# Patient Record
Sex: Male | Born: 1980 | Race: White | Hispanic: No | Marital: Single | Smoking: Current some day smoker
Health system: Southern US, Community
[De-identification: ages and names within clinical notes are randomized; demographics above are authoritative.]

## PROBLEM LIST (undated history)

## (undated) DIAGNOSIS — I10 Essential (primary) hypertension: Secondary | ICD-10-CM

## (undated) DIAGNOSIS — E785 Hyperlipidemia, unspecified: Secondary | ICD-10-CM

## (undated) DIAGNOSIS — T7840XA Allergy, unspecified, initial encounter: Secondary | ICD-10-CM

## (undated) DIAGNOSIS — G43909 Migraine, unspecified, not intractable, without status migrainosus: Secondary | ICD-10-CM

## (undated) HISTORY — DX: Migraine, unspecified, not intractable, without status migrainosus: G43.909

## (undated) HISTORY — DX: Allergy, unspecified, initial encounter: T78.40XA

## (undated) HISTORY — PX: KNEE SURGERY: SHX244

## (undated) HISTORY — DX: Hyperlipidemia, unspecified: E78.5

## (undated) HISTORY — DX: Essential (primary) hypertension: I10

---

## 2014-12-30 ENCOUNTER — Encounter (HOSPITAL_COMMUNITY): Payer: Self-pay | Admitting: Emergency Medicine

## 2014-12-30 ENCOUNTER — Emergency Department (HOSPITAL_COMMUNITY)
Admission: EM | Admit: 2014-12-30 | Discharge: 2014-12-31 | Disposition: A | Discharge status: Home / Self Care | Payer: BLUE CROSS/BLUE SHIELD | Attending: Emergency Medicine | Admitting: Emergency Medicine

## 2014-12-30 DIAGNOSIS — Z79899 Other long term (current) drug therapy: Secondary | ICD-10-CM | POA: Insufficient documentation

## 2014-12-30 DIAGNOSIS — F419 Anxiety disorder, unspecified: Secondary | ICD-10-CM | POA: Insufficient documentation

## 2014-12-30 DIAGNOSIS — R091 Pleurisy: Secondary | ICD-10-CM | POA: Diagnosis not present

## 2014-12-30 DIAGNOSIS — R0602 Shortness of breath: Secondary | ICD-10-CM | POA: Diagnosis not present

## 2014-12-30 DIAGNOSIS — R110 Nausea: Secondary | ICD-10-CM | POA: Diagnosis not present

## 2014-12-30 DIAGNOSIS — R079 Chest pain, unspecified: Secondary | ICD-10-CM | POA: Diagnosis present

## 2014-12-30 NOTE — ED Notes (Signed)
Pt c/o L chest pain radiating to L axila onset 1700 while watching movie, sharp with SHob and brief dizziness.

## 2014-12-31 ENCOUNTER — Emergency Department (HOSPITAL_COMMUNITY): Payer: BLUE CROSS/BLUE SHIELD

## 2014-12-31 LAB — CBC WITH DIFFERENTIAL/PLATELET
BASOS PCT: 1 % (ref 0–1)
Basophils Absolute: 0.1 10*3/uL (ref 0.0–0.1)
EOS ABS: 0.1 10*3/uL (ref 0.0–0.7)
Eosinophils Relative: 1 % (ref 0–5)
HCT: 44.7 % (ref 39.0–52.0)
Hemoglobin: 15.4 g/dL (ref 13.0–17.0)
Lymphocytes Relative: 30 % (ref 12–46)
Lymphs Abs: 2.5 10*3/uL (ref 0.7–4.0)
MCH: 30 pg (ref 26.0–34.0)
MCHC: 34.5 g/dL (ref 30.0–36.0)
MCV: 87 fL (ref 78.0–100.0)
Monocytes Absolute: 0.7 10*3/uL (ref 0.1–1.0)
Monocytes Relative: 9 % (ref 3–12)
NEUTROS ABS: 5.1 10*3/uL (ref 1.7–7.7)
NEUTROS PCT: 59 % (ref 43–77)
Platelets: 304 10*3/uL (ref 150–400)
RBC: 5.14 MIL/uL (ref 4.22–5.81)
RDW: 12.6 % (ref 11.5–15.5)
WBC: 8.5 10*3/uL (ref 4.0–10.5)

## 2014-12-31 LAB — COMPREHENSIVE METABOLIC PANEL
ALT: 24 U/L (ref 17–63)
AST: 19 U/L (ref 15–41)
Albumin: 4.3 g/dL (ref 3.5–5.0)
Alkaline Phosphatase: 81 U/L (ref 38–126)
Anion gap: 10 (ref 5–15)
BUN: 13 mg/dL (ref 6–20)
CALCIUM: 9.3 mg/dL (ref 8.9–10.3)
CO2: 22 mmol/L (ref 22–32)
CREATININE: 0.9 mg/dL (ref 0.61–1.24)
Chloride: 106 mmol/L (ref 101–111)
GFR calc Af Amer: >60 mL/min (ref >60)
Glucose, Bld: 107 mg/dL — ABNORMAL HIGH (ref 65–99)
POTASSIUM: 3.4 mmol/L — AB (ref 3.5–5.1)
SODIUM: 138 mmol/L (ref 135–145)
Total Bilirubin: 0.6 mg/dL (ref 0.3–1.2)
Total Protein: 7.4 g/dL (ref 6.5–8.1)

## 2014-12-31 LAB — I-STAT TROPONIN, ED: TROPONIN I, POC: 0 ng/mL (ref 0.00–0.08)

## 2014-12-31 LAB — TROPONIN I

## 2014-12-31 LAB — D-DIMER, QUANTITATIVE: D-Dimer, Quant: <0.27 ug/mL-FEU (ref 0.00–0.48)

## 2014-12-31 MED ORDER — IBUPROFEN 600 MG PO TABS: 600.0000 mg | ORAL_TABLET | Freq: Three times a day (TID) | ORAL | Status: DC

## 2014-12-31 MED ORDER — KETOROLAC TROMETHAMINE 60 MG/2ML IM SOLN
60.0000 mg | Freq: Once | INTRAMUSCULAR | Status: AC
  Administered 2014-12-31: 60 mg via INTRAMUSCULAR
  Filled 2014-12-31: qty 2

## 2014-12-31 NOTE — ED Notes (Signed)
Pt resting quietly with eyes closed

## 2014-12-31 NOTE — Discharge Instructions (Signed)

## 2014-12-31 NOTE — ED Notes (Signed)
Pt alert, oriented, and ambulatory upon DC. He was advised to establish and follow up with PCP.

## 2014-12-31 NOTE — ED Provider Notes (Signed)
CSN: [id number]     Arrival date & time 12/30/14  2304 History   First MD Initiated Contact with Patient 12/30/14 2340     Chief Complaint  Patient presents with  . Chest Pain     (Consider location/radiation/quality/duration/timing/severity/associated sxs/prior Treatment) HPI Patient presents with acute onset left parasternal chest pain that radiates to the left pectoralis and axilla. The pain started at 5:00 while watching a movie. He had some mild shortness of breath and nausea associated with this. Denied any cough, fever or chills. No recent extended travel or surgeries. No lower extremity swelling or pain. The chest pain is worse with deep breathing. States it's difficult to take a full breath. No previously similar symptoms. Does not smoke cigarettes. No family history of thromboembolic disease. Father died of MI in his late 44s. Patient states he's been under increased stress lately. History reviewed. No pertinent past medical history. Past Surgical History  Procedure Laterality Date  . Knee surgery     No family history on file. History  Substance Use Topics  . Smoking status: Never Smoker   . Smokeless tobacco: Not on file  . Alcohol Use: Yes     Comment: rare    Review of Systems  Constitutional: Negative for fever and chills.  Respiratory: Positive for shortness of breath. Negative for cough and wheezing.   Cardiovascular: Positive for chest pain. Negative for palpitations and leg swelling.  Gastrointestinal: Positive for nausea. Negative for vomiting, abdominal pain and diarrhea.  Musculoskeletal: Negative for back pain, neck pain and neck stiffness.  Skin: Negative for rash and wound.  Neurological: Positive for dizziness and light-headedness. Negative for syncope, weakness, numbness and headaches.  All other systems reviewed and are negative.     Allergies  Review of patient's allergies indicates no known allergies.  Home Medications   Prior to Admission  medications   Medication Sig Start Date End Date Taking? Authorizing Provider  albuterol (PROVENTIL HFA;VENTOLIN HFA) 108 (90 BASE) MCG/ACT inhaler Inhale 1-2 puffs into the lungs every 6 (six) hours as needed for wheezing or shortness of breath.   Yes Historical Provider, MD  Chilton Si Tea, Camillia sinensis, (GREEN TEA EXTRACT PO) Take 2 tablets by mouth 2 (two) times daily.   Yes Historical Provider, MD  ibuprofen (ADVIL,MOTRIN) 600 MG tablet Take 1 tablet (600 mg total) by mouth 3 (three) times daily after meals. 12/31/14   Loren Racer, MD  Ranitidine HCl (ACID REDUCER PO) Take 1 tablet by mouth daily as needed (acid reflux).   Yes Historical Provider, MD   BP 142/81 mmHg  Pulse 72  Temp(Src) 98 F (36.7 C) (Oral)  Resp 23  Ht 5\' 10"  (1.778 m)  Wt 278 lb (126.1 kg)  BMI 39.89 kg/m2  SpO2 96% Physical Exam  Constitutional: He is oriented to person, place, and time. He appears well-developed and well-nourished. No distress.  Mildly anxious  HENT:  Head: Normocephalic and atraumatic.  Mouth/Throat: Oropharynx is clear and moist.  Eyes: EOM are normal. Pupils are equal, round, and reactive to light.  Neck: Normal range of motion. Neck supple.  Cardiovascular: Normal rate and regular rhythm.  Exam reveals no gallop and no friction rub.   No murmur heard. Pulmonary/Chest: Effort normal and breath sounds normal. No respiratory distress. He has no wheezes. He has no rales. He exhibits no tenderness.  Abdominal: Soft. Bowel sounds are normal. He exhibits no distension and no mass. There is no tenderness. There is no rebound and no guarding.  Musculoskeletal: Normal range of motion. He exhibits no edema or tenderness.  No calf swelling or tenderness.  Neurological: He is alert and oriented to person, place, and time.  Moves all extremities without deficit. Sensation is grossly intact.  Skin: Skin is warm and dry. No rash noted. No erythema.  Psychiatric: His behavior is normal.  Nursing  note and vitals reviewed.   ED Course  Procedures (including critical care time) Labs Review Labs Reviewed  COMPREHENSIVE METABOLIC PANEL - Abnormal; Notable for the following:    Potassium 3.4 (*)    Glucose, Bld 107 (*)    All other components within normal limits  CBC WITH DIFFERENTIAL/PLATELET  TROPONIN I  D-DIMER, QUANTITATIVE (NOT AT [facility])  Rosezena Sensor, ED    Imaging Review Dg Chest 2 View  12/31/2014   CLINICAL DATA:  Left chest pain radiating in the left axilla, onset at 17:00  EXAM: CHEST  2 VIEW  COMPARISON:  None.  FINDINGS: The heart size and mediastinal contours are within normal limits. Both lungs are clear. The visualized skeletal structures are unremarkable.  IMPRESSION: No active cardiopulmonary disease.   Electronically Signed   By: Ellery Plunk M.D.   On: 12/31/2014 01:56     EKG Interpretation   Date/Time:  Saturday December 30 2014 23:12:01 EDT Ventricular Rate:  92 PR Interval:  146 QRS Duration: 92 QT Interval:  339 QTC Calculation: 419 R Axis:   63 Text Interpretation:  Age not entered, assumed to be  34 years old for  purpose of ECG interpretation Sinus rhythm Inferior infarct, old Confirmed  by Ranae Palms  MD, Xena Propst ([id number]) on 12/31/2014 12:16:44 AM      MDM   Final diagnoses:  Chest pain  Pleurisy   Troponin 2 is normal. EKG without any evidence of ischemia. Patient also has a normal d-dimer. Chest pain is consistent with pleurisy. We'll treat with NSAIDs. Return precautions given.     Loren Racer, MD 12/31/14 [phone]

## 2015-01-24 ENCOUNTER — Ambulatory Visit (INDEPENDENT_AMBULATORY_CARE_PROVIDER_SITE_OTHER): Payer: BLUE CROSS/BLUE SHIELD | Admitting: Family

## 2015-01-24 ENCOUNTER — Encounter: Payer: Self-pay | Admitting: Family

## 2015-01-24 VITALS — BP 128/90 | HR 83 | Temp 98.1°F | Resp 18 | Ht 70.0 in | Wt 272.0 lb

## 2015-01-24 DIAGNOSIS — F32A Depression, unspecified: Secondary | ICD-10-CM | POA: Insufficient documentation

## 2015-01-24 DIAGNOSIS — F418 Other specified anxiety disorders: Secondary | ICD-10-CM | POA: Diagnosis not present

## 2015-01-24 DIAGNOSIS — F419 Anxiety disorder, unspecified: Principal | ICD-10-CM

## 2015-01-24 DIAGNOSIS — R0789 Other chest pain: Secondary | ICD-10-CM | POA: Diagnosis not present

## 2015-01-24 DIAGNOSIS — F329 Major depressive disorder, single episode, unspecified: Secondary | ICD-10-CM | POA: Insufficient documentation

## 2015-01-24 MED ORDER — SERTRALINE HCL 50 MG PO TABS: 50.0000 mg | ORAL_TABLET | Freq: Every day | ORAL | Status: DC

## 2015-01-24 MED ORDER — LORAZEPAM 0.5 MG PO TABS: 0.5000 mg | ORAL_TABLET | Freq: Two times a day (BID) | ORAL | Status: DC | PRN

## 2015-01-24 NOTE — Progress Notes (Signed)
Subjective:    Patient ID: Andrew Luna, male    DOB: 01/04/1981, 34 y.o.   MRN: [id number]  Chief Complaint  Patient presents with  . Establish Care    Chest pain and Anxiety    HPI:  Andrew Luna is a 34 y.o. male with a PMH of seasonal allergies, hyperlipidemia, hypertension, and migraines who presents today to establish care.    1.) Chest pain - Associated symptoms of chest pain has been going on for about 1 month. Indicates that he was sitting and watching a movie when he began experiencing chest pain. He was seen in the Emergency Department and troponins, d-dimer and EKG were normal. 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 Has significant family history for heart disease and is currently being followed by cardiologist in [place] at [facility]. Has stress test already scheduled for 7/8. Continues to experience the associated symptom of chest pain which he believes may be related to anxiety. The pain is described as occasionally sharp and is located in the center of his chest. There are no modifying factors that make it better.   2.) Anxiety - Associated symptom of anxiety has been going on for about 1 year with worsening starting in April related to family and job stress. Notes that when he gets anxious he does have increased chest pain and the anxiety has also effected his sleep. Has not been on any medication to help this. Reports that he is usually very happy.   Depression screen PHQ 2/9 01/24/2015  Decreased Interest 3  Down, Depressed, Hopeless 3  PHQ - 2 Score 6  Altered sleeping 3  Tired, decreased energy 3  Change in appetite 3  Feeling bad or failure about yourself  0  Trouble concentrating 2  Moving slowly or fidgety/restless 2  Suicidal thoughts 0  PHQ-9 Score 19    No Known Allergies   Outpatient Prescriptions Prior to Visit  Medication Sig Dispense Refill  . Ranitidine HCl (ACID REDUCER PO) Take 1 tablet by mouth daily as needed (acid  reflux).    Marland Kitchen. albuterol (PROVENTIL HFA;VENTOLIN HFA) 108 (90 BASE) MCG/ACT inhaler Inhale 1-2 puffs into the lungs every 6 (six) hours as needed for wheezing or shortness of breath.    Chilton Si. Green Tea, Camillia sinensis, (GREEN TEA EXTRACT PO) Take 2 tablets by mouth 2 (two) times daily.    Marland Kitchen. ibuprofen (ADVIL,MOTRIN) 600 MG tablet Take 1 tablet (600 mg total) by mouth 3 (three) times daily after meals. 30 tablet 0   No facility-administered medications prior to visit.     Past Medical History  Diagnosis Date  . Hyperlipidemia   . Hypertension   . Migraines   . Allergy      Past Surgical History  Procedure Laterality Date  . Knee surgery       Family History  Problem Relation Age of Onset  . Hyperlipidemia Mother   . Hypertension Mother   . Hyperlipidemia Father   . Hypertension Father   . Diabetes Father   . Heart disease Father   . Kidney disease Father      History   Social History  . Marital Status: Single    Spouse Name: N/A  . Number of Children: 1  . Years of Education: 16   Occupational History  . Football Coach    Social History Main Topics  . Smoking status: Current Some Day Smoker    Types: Cigars  . Smokeless tobacco:  Current User    Types: Snuff, Chew  . Alcohol Use: Yes     Comment: rare  . Drug Use: No  . Sexual Activity: Not on file   Other Topics Concern  . Not on file   Social History Narrative   Fun: Cook, workout, read     Review of Systems  Constitutional: Negative for fever and chills.  Respiratory: Positive for chest tightness. Negative for cough and shortness of breath.   Cardiovascular: Negative for chest pain, palpitations and leg swelling.  Psychiatric/Behavioral: Positive for sleep disturbance and dysphoric mood. Negative for suicidal ideas and agitation.      Objective:    BP 128/90 mmHg  Pulse 83  Temp(Src) 98.1 F (36.7 C) (Oral)  Resp 18  Ht  (1.778 m)  Wt 272 lb (123.378 kg)  BMI 39.03 kg/m2  SpO2  96% Nursing note and vital signs reviewed.  Physical Exam  Constitutional: He is oriented to person, place, and time. He appears well-developed and well-nourished. No distress.  Cardiovascular: Normal rate, regular rhythm, normal heart sounds and intact distal pulses.   Pulmonary/Chest: Effort normal and breath sounds normal.  Neurological: He is alert and oriented to person, place, and time.  Skin: Skin is warm and dry.  Psychiatric: His behavior is normal. Judgment and thought content normal. His mood appears anxious. He exhibits a depressed mood.       Assessment & Plan:   Problem List Items Addressed This Visit      Other   Anxiety and depression - Primary    Symptoms and exam consistent with anxiety and depression as a result of work and family stressors. PHQ score of 19. Denies suicidal ideations. Recommend seeking counseling from his employee assistance program. Start sertraline daily. Start lorazepam as needed for anxiety. Patient advised to seek emergency care if symptoms of suicidal ideations develop. Follow up in 1 month to determine effectiveness.      Relevant Medications   sertraline (ZOLOFT) 50 MG tablet   LORazepam (ATIVAN) 0.5 MG tablet   Other chest pain    Continues to experience other chest pain for which current workup to this point has been negative including EKGs and troponins. He is scheduled to undergo a stress test in [place] with a cardiologist. Follow-up after cardiology stress test.

## 2015-01-24 NOTE — Patient Instructions (Signed)
Thank you for choosing [facility].  Summary/Instructions:  Your prescription(s) have been submitted to your pharmacy or been printed and provided for you. Please take as directed and contact our office if you believe you are having problem(s) with the medication(s) or have any questions.  If your symptoms worsen or fail to improve, please contact our office for further instruction, or in case of emergency go directly to the emergency room at the closest medical facility.   Generalized Anxiety Disorder Generalized anxiety disorder (GAD) is a mental disorder. It interferes with life functions, including relationships, work, and school. GAD is different from normal anxiety, which everyone experiences at some point in their lives in response to specific life events and activities. Normal anxiety actually helps us prepare for and get through these life events and activities. Normal anxiety goes away after the event or activity is over.  GAD causes anxiety that is not necessarily related to specific events or activities. It also causes excess anxiety in proportion to specific events or activities. The anxiety associated with GAD is also difficult to control. GAD can vary from mild to severe. People with severe GAD can have intense waves of anxiety with physical symptoms (panic attacks).  SYMPTOMS The anxiety and worry associated with GAD are difficult to control. This anxiety and worry are related to many life events and activities and also occur more days than not for 6 months or longer. People with GAD also have three or more of the following symptoms (one or more in children): 1. Restlessness.  2. Fatigue. 3. Difficulty concentrating.  4. Irritability. 5. Muscle tension. 6. Difficulty sleeping or unsatisfying sleep. DIAGNOSIS GAD is diagnosed through an assessment by your health care provider. Your health care provider will ask you questions aboutyour mood,physical symptoms, and events  in your life. Your health care provider may ask you about your medical history and use of alcohol or drugs, including prescription medicines. Your health care provider may also do a physical exam and blood tests. Certain medical conditions and the use of certain substances can cause symptoms similar to those associated with GAD. Your health care provider may refer you to a mental health specialist for further evaluation. TREATMENT The following therapies are usually used to treat GAD:   Medication. Antidepressant medication usually is prescribed for long-term daily control. Antianxiety medicines may be added in severe cases, especially when panic attacks occur.   Talk therapy (psychotherapy). Certain types of talk therapy can be helpful in treating GAD by providing support, education, and guidance. A form of talk therapy called cognitive behavioral therapy can teach you healthy ways to think about and react to daily life events and activities.  Stress managementtechniques. These include yoga, meditation, and exercise and can be very helpful when they are practiced regularly. A mental health specialist can help determine which treatment is best for you. Some people see improvement with one therapy. However, other people require a combination of therapies. Document Released: 11/01/2012 Document Revised: 11/21/2013 Document Reviewed: 11/01/2012 [facility] Patient Information 2015 [place]. This information is not intended to replace advice given to you by your health care provider. Make sure you discuss any questions you have with your health care provider.  Depression Depression refers to feeling sad, low, down in the dumps, blue, gloomy, or empty. In general, there are two kinds of depression: 7. Normal sadness or normal grief. This kind of depression is one that we all feel from time to time after upsetting life experiences, such as  the loss of a job or the ending of a relationship. This kind  of depression is considered normal, is short lived, and resolves within a few days to 2 weeks. Depression experienced after the loss of a loved one (bereavement) often lasts longer than 2 weeks but normally gets better with time. 8. Clinical depression. This kind of depression lasts longer than normal sadness or normal grief or interferes with your ability to function at home, at work, and in school. It also interferes with your personal relationships. It affects almost every aspect of your life. Clinical depression is an illness. Symptoms of depression can also be caused by conditions other than those mentioned above, such as:  Physical illness. Some physical illnesses, including underactive thyroid gland (hypothyroidism), severe anemia, specific types of cancer, diabetes, uncontrolled seizures, heart and lung problems, strokes, and chronic pain are commonly associated with symptoms of depression.  Side effects of some prescription medicine. In some people, certain types of medicine can cause symptoms of depression.  Substance abuse. Abuse of alcohol and illicit drugs can cause symptoms of depression. SYMPTOMS Symptoms of normal sadness and normal grief include the following:  Feeling sad or crying for short periods of time.  Not caring about anything (apathy).  Difficulty sleeping or sleeping too much.  No longer able to enjoy the things you used to enjoy.  Desire to be by oneself all the time (social isolation).  Lack of energy or motivation.  Difficulty concentrating or remembering.  Change in appetite or weight.  Restlessness or agitation. Symptoms of clinical depression include the same symptoms of normal sadness or normal grief and also the following symptoms:  Feeling sad or crying all the time.  Feelings of guilt or worthlessness.  Feelings of hopelessness or helplessness.  Thoughts of suicide or the desire to harm yourself (suicidal ideation).  Loss of touch with  reality (psychotic symptoms). Seeing or hearing things that are not real (hallucinations) or having false beliefs about your life or the people around you (delusions and paranoia). DIAGNOSIS  The diagnosis of clinical depression is usually based on how bad the symptoms are and how long they have lasted. Your health care provider will also ask you questions about your medical history and substance use to find out if physical illness, use of prescription medicine, or substance abuse is causing your depression. Your health care provider may also order blood tests. TREATMENT  Often, normal sadness and normal grief do not require treatment. However, sometimes antidepressant medicine is given for bereavement to ease the depressive symptoms until they resolve. The treatment for clinical depression depends on how bad the symptoms are but often includes antidepressant medicine, counseling with a mental health professional, or both. Your health care provider will help to determine what treatment is best for you. Depression caused by physical illness usually goes away with appropriate medical treatment of the illness. If prescription medicine is causing depression, talk with your health care provider about stopping the medicine, decreasing the dose, or changing to another medicine. Depression caused by the abuse of alcohol or illicit drugs goes away when you stop using these substances. Some adults need professional help in order to stop drinking or using drugs. SEEK IMMEDIATE MEDICAL CARE IF:  You have thoughts about hurting yourself or others.  You lose touch with reality (have psychotic symptoms).  You are taking medicine for depression and have a serious side effect. FOR MORE INFORMATION  National Alliance on Mental Illness: www.nami.[employer] of Mental  Health: http://www.maynard.net/ Document Released: 07/04/2000 Document Revised: 11/21/2013 Document Reviewed: 10/06/2011 [facility] Patient  Information 2015 [place], [place]. This information is not intended to replace advice given to you by your health care provider. Make sure you discuss any questions you have with your health care provider.

## 2015-01-24 NOTE — Assessment & Plan Note (Signed)
Continues to experience other chest pain for which current workup to this point has been negative including EKGs and troponins. He is scheduled to undergo a stress test in [place] with a cardiologist. Follow-up after cardiology stress test.

## 2015-01-24 NOTE — Assessment & Plan Note (Signed)
Symptoms and exam consistent with anxiety and depression as a result of work and family stressors. PHQ score of 19. Denies suicidal ideations. Recommend seeking counseling from his employee assistance program. Start sertraline daily. Start lorazepam as needed for anxiety. Patient advised to seek emergency care if symptoms of suicidal ideations develop. Follow up in 1 month to determine effectiveness.

## 2015-01-24 NOTE — Progress Notes (Signed)
Pre visit review using our clinic review tool, if applicable. No additional management support is needed unless otherwise documented below in the visit note. 

## 2015-01-24 NOTE — Progress Notes (Deleted)
Subjective:    Patient ID: Andrew Luna, male    DOB: 07/24/1980, 34 y.o.   MRN: [id number]  Chief Complaint  Patient presents with  . Establish Care    CPE, Fasting    HPI:  Andrew Luna is a 34 y.o. male who presents today for an annual wellness visit.   1) Health Maintenance -   Diet -   Exercise -   2) Preventative Exams / Immunizations:  Dental --  Vision --   Health Maintenance  Topic Date Due  . HIV Screening  05/24/1995  . TETANUS/TDAP  05/24/1999  . INFLUENZA VACCINE  02/19/2015     There is no immunization history on file for this patient.   Review of Systems  Constitutional: Denies fever, chills, fatigue, or significant weight gain/loss. HENT: Head: Denies headache or neck pain Ears: Denies changes in hearing, ringing in ears, earache, drainage Nose: Denies discharge, stuffiness, itching, nosebleed, sinus pain Throat: Denies sore throat, hoarseness, dry mouth, sores, thrush Eyes: Denies loss/changes in vision, pain, redness, blurry/double vision, flashing lights Cardiovascular: Denies chest pain/discomfort, tightness, palpitations, shortness of breath with activity, difficulty lying down, swelling, sudden awakening with shortness of breath Respiratory: Denies shortness of breath, cough, sputum production, wheezing Gastrointestinal: Denies dysphasia, heartburn, change in appetite, nausea, change in bowel habits, rectal bleeding, constipation, diarrhea, yellow skin or eyes Genitourinary: Denies frequency, urgency, burning/pain, blood in urine, incontinence, change in urinary strength. Musculoskeletal: Denies muscle/joint pain, stiffness, back pain, redness or swelling of joints, trauma Skin: Denies rashes, lumps, itching, dryness, color changes, or hair/nail changes Neurological: Denies dizziness, fainting, seizures, weakness, numbness, tingling, tremor Psychiatric - Denies nervousness, stress, depression or memory loss Endocrine: Denies heat or cold  intolerance, sweating, frequent urination, excessive thirst, changes in appetite Hematologic: Denies ease of bruising or bleeding     Objective:     BP 128/90 mmHg  Pulse 83  Temp(Src) 98.1 F (36.7 C) (Oral)  Resp 18  Ht 5\' 10"  (1.778 m)  Wt 272 lb (123.378 kg)  BMI 39.03 kg/m2  SpO2 96% Nursing note and vital signs reviewed.  Physical Exam  Constitutional: He is oriented to person, place, and time. He appears well-developed and well-nourished.  HENT:  Head: Normocephalic.  Right Ear: Hearing, tympanic membrane, external ear and ear canal normal.  Left Ear: Hearing, tympanic membrane, external ear and ear canal normal.  Nose: Nose normal.  Mouth/Throat: Uvula is midline, oropharynx is clear and moist and mucous membranes are normal.  Eyes: Conjunctivae and EOM are normal. Pupils are equal, round, and reactive to light.  Neck: Neck supple. No JVD present. No tracheal deviation present. No thyromegaly present.  Cardiovascular: Normal rate, regular rhythm, normal heart sounds and intact distal pulses.   Pulmonary/Chest: Effort normal and breath sounds normal.  Abdominal: Soft. Bowel sounds are normal. He exhibits no distension and no mass. There is no tenderness. There is no rebound and no guarding.  Musculoskeletal: Normal range of motion. He exhibits no edema or tenderness.  Lymphadenopathy:    He has no cervical adenopathy.  Neurological: He is alert and oriented to person, place, and time. He has normal reflexes. No cranial nerve deficit. He exhibits normal muscle tone. Coordination normal.  Skin: Skin is warm and dry.  Psychiatric: He has a normal mood and affect. His behavior is normal. Judgment and thought content normal.       Assessment & Plan:   Problem List Items Addressed This Visit    None

## 2015-02-22 ENCOUNTER — Ambulatory Visit (INDEPENDENT_AMBULATORY_CARE_PROVIDER_SITE_OTHER): Payer: BLUE CROSS/BLUE SHIELD | Admitting: Family

## 2015-02-22 ENCOUNTER — Encounter: Payer: Self-pay | Admitting: Family

## 2015-02-22 VITALS — BP 122/82 | HR 76 | Temp 98.0°F | Resp 18 | Ht 70.0 in | Wt 268.0 lb

## 2015-02-22 DIAGNOSIS — F419 Anxiety disorder, unspecified: Principal | ICD-10-CM

## 2015-02-22 DIAGNOSIS — F329 Major depressive disorder, single episode, unspecified: Secondary | ICD-10-CM

## 2015-02-22 DIAGNOSIS — F418 Other specified anxiety disorders: Secondary | ICD-10-CM

## 2015-02-22 DIAGNOSIS — F32A Depression, unspecified: Secondary | ICD-10-CM

## 2015-02-22 MED ORDER — SERTRALINE HCL 50 MG PO TABS: 50.0000 mg | ORAL_TABLET | Freq: Every day | ORAL | Status: DC

## 2015-02-22 NOTE — Progress Notes (Signed)
   Subjective:    Patient ID: Andrew Luna, male    DOB: 05-18-1981, 34 y.o.   MRN: [id number]  Chief Complaint  Patient presents with  . Follow-up    hasn't had to use the ativan yet but says feels a difference with zoloft, refill of zoloft    HPI:  Andrew Luna is a 34 y.o. male with a PMH of allergies, hyperlipidemia, hypertension, migraines and depression who presents today for an office follow up.     1.) Anxiety and depression - Recently seen in the office for anxiety and depression as a result of work stressors and started on Zoloft. Indicates that he takes the medications as prescribed and has had some loose stools with medication. Has yet to take the Ativan at this time. Notes that he has seen some improvement in symptoms but his symptoms are more manageable with the current regimen. Currently takes it at night which helps with him falling asleep.   No Known Allergies   Current Outpatient Prescriptions on File Prior to Visit  Medication Sig Dispense Refill  . famotidine (PEPCID) 20 MG tablet Take 20 mg by mouth daily.    Marland Kitchen LORazepam (ATIVAN) 0.5 MG tablet Take 1 tablet (0.5 mg total) by mouth 2 (two) times daily as needed for anxiety. 40 tablet 0  . Ranitidine HCl (ACID REDUCER PO) Take 1 tablet by mouth daily as needed (acid reflux).     No current facility-administered medications on file prior to visit.    Review of Systems  Constitutional: Negative for fever and chills.  Psychiatric/Behavioral: The patient is nervous/anxious.       Objective:    BP 122/82 mmHg  Pulse 76  Temp(Src) 98 F (36.7 C) (Oral)  Resp 18  Ht  (1.778 m)  Wt 268 lb (121.564 kg)  BMI 38.45 kg/m2  SpO2 97% Nursing note and vital signs reviewed.  Physical Exam  Constitutional: He is oriented to person, place, and time. He appears well-developed and well-nourished. No distress.  Cardiovascular: Normal rate, regular rhythm, normal heart sounds and intact distal pulses.     Pulmonary/Chest: Effort normal and breath sounds normal.  Neurological: He is alert and oriented to person, place, and time.  Skin: Skin is warm and dry.  Psychiatric: He has a normal mood and affect. His behavior is normal. Judgment and thought content normal.       Assessment & Plan:   Problem List Items Addressed This Visit      Other   Anxiety and depression - Primary    Stable with current regimen of Zoloft and Ativan. Takes the medication as prescribed and has a mild effect of loose stool. His job situation has improved since previous visit. Continue current dosage of Ativan and Zoloft. Follow up in 3 months.       Relevant Medications   sertraline (ZOLOFT) 50 MG tablet

## 2015-02-22 NOTE — Progress Notes (Signed)
Pre visit review using our clinic review tool, if applicable. No additional management support is needed unless otherwise documented below in the visit note. 

## 2015-02-22 NOTE — Patient Instructions (Signed)
Thank you for choosing [facility].  Summary/Instructions:  Please continue to take your medications as prescribed.   Your prescription(s) have been submitted to your pharmacy or been printed and provided for you. Please take as directed and contact our office if you believe you are having problem(s) with the medication(s) or have any questions.  If your symptoms worsen or fail to improve, please contact our office for further instruction, or in case of emergency go directly to the emergency room at the closest medical facility.     

## 2015-02-22 NOTE — Assessment & Plan Note (Signed)
Stable with current regimen of Zoloft and Ativan. Takes the medication as prescribed and has a mild effect of loose stool. His job situation has improved since previous visit. Continue current dosage of Ativan and Zoloft. Follow up in 3 months.

## 2015-05-28 ENCOUNTER — Encounter: Payer: Self-pay | Admitting: Family

## 2015-05-28 ENCOUNTER — Other Ambulatory Visit: Payer: Self-pay | Admitting: Family

## 2015-05-28 DIAGNOSIS — F419 Anxiety disorder, unspecified: Principal | ICD-10-CM

## 2015-05-28 DIAGNOSIS — F329 Major depressive disorder, single episode, unspecified: Secondary | ICD-10-CM

## 2015-05-28 MED ORDER — SERTRALINE HCL 50 MG PO TABS: 50.0000 mg | ORAL_TABLET | Freq: Every day | ORAL | Status: DC

## 2015-05-28 NOTE — Addendum Note (Signed)
Addended by: Etheleen MayhewOX, HEATHER C on: 05/28/2015 08:52 AM   Modules accepted: Orders

## 2015-06-04 ENCOUNTER — Ambulatory Visit (INDEPENDENT_AMBULATORY_CARE_PROVIDER_SITE_OTHER): Payer: BLUE CROSS/BLUE SHIELD | Admitting: Family

## 2015-06-04 ENCOUNTER — Encounter: Payer: Self-pay | Admitting: Family

## 2015-06-04 VITALS — BP 130/88 | HR 85 | Temp 98.2°F | Resp 18 | Ht 70.0 in | Wt 276.0 lb

## 2015-06-04 DIAGNOSIS — F329 Major depressive disorder, single episode, unspecified: Secondary | ICD-10-CM

## 2015-06-04 DIAGNOSIS — F418 Other specified anxiety disorders: Secondary | ICD-10-CM | POA: Diagnosis not present

## 2015-06-04 DIAGNOSIS — F32A Depression, unspecified: Secondary | ICD-10-CM

## 2015-06-04 DIAGNOSIS — Z23 Encounter for immunization: Secondary | ICD-10-CM

## 2015-06-04 DIAGNOSIS — F419 Anxiety disorder, unspecified: Principal | ICD-10-CM

## 2015-06-04 MED ORDER — SERTRALINE HCL 50 MG PO TABS: 50.0000 mg | ORAL_TABLET | Freq: Every day | ORAL | Status: DC

## 2015-06-04 NOTE — Patient Instructions (Addendum)
Thank you for choosing [facility].  Summary/Instructions:  Please continue to take your medication as prescribed.   When/If you need a refill of your lorazepam please let us know.   Your prescription(s) have been submitted to your pharmacy or been printed and provided for you. Please take as directed and contact our office if you believe you are having problem(s) with the medication(s) or have any questions.  If your symptoms worsen or fail to improve, please contact our office for further instruction, or in case of emergency go directly to the emergency room at the closest medical facility.

## 2015-06-04 NOTE — Progress Notes (Signed)
   Subjective:    Patient ID: Lenoria ChimeClayton Sisemore, male    DOB: 03/21/1981, 34 y.o.   MRN: [id number]  Chief Complaint  Patient presents with  . Follow-up    Anxiety and depression, medication is working great    HPI:  Lenoria ChimeClayton Pullin is a 34 y.o. male who  has a past medical history of Hyperlipidemia; Hypertension; Migraines; and Allergy. and presents today for a follow up office visit.   1.) Anxiety and depression - Currently maintained on sertraline and lorazepam. Takes the medication as prescribed and denies adverse side effects or suicidal ideations. Only using the lorazepam sporadically. Family and friends continue to notice positive changes in his mood overall.     No Known Allergies   Current Outpatient Prescriptions on File Prior to Visit  Medication Sig Dispense Refill  . atorvastatin (LIPITOR) 20 MG tablet Take 20 mg by mouth daily.    . famotidine (PEPCID) 20 MG tablet Take 20 mg by mouth daily.    Marland Kitchen. LORazepam (ATIVAN) 0.5 MG tablet Take 1 tablet (0.5 mg total) by mouth 2 (two) times daily as needed for anxiety. 40 tablet 0  . Ranitidine HCl (ACID REDUCER PO) Take 1 tablet by mouth daily as needed (acid reflux).     No current facility-administered medications on file prior to visit.    Review of Systems  Constitutional: Negative for fever, chills and unexpected weight change.  Psychiatric/Behavioral: Negative for dysphoric mood. The patient is not nervous/anxious.       Objective:    BP 130/88 mmHg  Pulse 85  Temp(Src) 98.2 F (36.8 C) (Oral)  Resp 18  Ht 5\' 10"  (1.778 m)  Wt 276 lb (125.193 kg)  BMI 39.60 kg/m2  SpO2 97% Nursing note and vital signs reviewed.  Physical Exam  Constitutional: He is oriented to person, place, and time. He appears well-developed and well-nourished. No distress.  Cardiovascular: Normal rate, regular rhythm, normal heart sounds and intact distal pulses.   Pulmonary/Chest: Effort normal and breath sounds normal.  Neurological: He is  alert and oriented to person, place, and time.  Skin: Skin is warm and dry.  Psychiatric: He has a normal mood and affect. His behavior is normal. Judgment and thought content normal. His mood appears not anxious. His affect is not angry. He does not exhibit a depressed mood.       Assessment & Plan:   Problem List Items Addressed This Visit      Other   Anxiety and depression - Primary    Depression and anxiety are stable with current regimen and no adverse side effects or thoughts of suicidal ideations. Continue current dosages of sertraline and lorazepam. Follow up in 6 months or sooner.       Relevant Medications   sertraline (ZOLOFT) 50 MG tablet    Other Visit Diagnoses    Encounter for immunization

## 2015-06-04 NOTE — Assessment & Plan Note (Signed)
Depression and anxiety are stable with current regimen and no adverse side effects or thoughts of suicidal ideations. Continue current dosages of sertraline and lorazepam. Follow up in 6 months or sooner.

## 2015-06-04 NOTE — Progress Notes (Signed)
Pre visit review using our clinic review tool, if applicable. No additional management support is needed unless otherwise documented below in the visit note. 

## 2015-12-03 ENCOUNTER — Encounter: Payer: Self-pay | Admitting: Family

## 2015-12-03 ENCOUNTER — Ambulatory Visit (INDEPENDENT_AMBULATORY_CARE_PROVIDER_SITE_OTHER): Payer: BLUE CROSS/BLUE SHIELD | Admitting: Family

## 2015-12-03 VITALS — BP 138/80 | HR 84 | Temp 98.5°F | Resp 16 | Ht 70.0 in | Wt 287.0 lb

## 2015-12-03 DIAGNOSIS — F418 Other specified anxiety disorders: Secondary | ICD-10-CM

## 2015-12-03 DIAGNOSIS — F419 Anxiety disorder, unspecified: Principal | ICD-10-CM

## 2015-12-03 DIAGNOSIS — F329 Major depressive disorder, single episode, unspecified: Secondary | ICD-10-CM

## 2015-12-03 MED ORDER — SERTRALINE HCL 50 MG PO TABS: 50.0000 mg | ORAL_TABLET | Freq: Every day | ORAL | Status: DC

## 2015-12-03 NOTE — Progress Notes (Signed)
Pre visit review using our clinic review tool, if applicable. No additional management support is needed unless otherwise documented below in the visit note. 

## 2015-12-03 NOTE — Assessment & Plan Note (Signed)
Depression and anxiety are well controlled with the current regimen and no adverse side effects. Does have some weight gain which he is working on exercise. Continue current dosage of sertraline and lorazepam. Follow up in 6 months or sooner if needed.

## 2015-12-03 NOTE — Progress Notes (Deleted)
Subjective:     Patient ID: Lenoria ChimeClayton Swaby, male   DOB: 11/27/1980, 35 y.o.   MRN: [id number]  HPI  Anxiety/Depression: Maintained on Zoloft for past 8 months. Feels like his symptoms are well managed on this dose.  Sleeping pattern and appetite is erratic at however due to work issues and normal for this time of year with his job.  Friends and family have noticed a difference in him and he is pleased with his ability to deal with life issues.   Review of Systems  Constitutional: Positive for unexpected weight change. Negative for appetite change and fatigue.       Exercising 3 times per week with small amount of cardio., more heavy lifting  Respiratory: Negative.   Cardiovascular: Negative.   Gastrointestinal: Negative.   Neurological: Negative.  Negative for seizures, light-headedness and headaches.  Psychiatric/Behavioral: Negative for suicidal ideas, sleep disturbance, self-injury and agitation.       Objective:   Physical Exam  Constitutional: He is oriented to person, place, and time. He appears well-developed and well-nourished.  Cardiovascular: Normal rate, regular rhythm and normal heart sounds.   Pulmonary/Chest: Effort normal and breath sounds normal.  Neurological: He is alert and oriented to person, place, and time. He has normal reflexes.  Psychiatric: He has a normal mood and affect. His behavior is normal. Judgment and thought content normal.       Assessment:     ***    Plan:     ***

## 2015-12-03 NOTE — Progress Notes (Signed)
   Subjective:    Patient ID: Andrew Luna, male    DOB: 04/16/1981, 35 y.o.   MRN: [id number]  Chief Complaint  Patient presents with  . Follow-up    HPI:  Andrew Luna is a 35 y.o. male who  has a past medical history of Hyperlipidemia; Hypertension; Migraines; and Allergy. and presents today for a follow up office visit.   1.) Anxiety and depression - Currently maintained on sertraline and lorazepam. Reports taking the medication as prescribed and denies adverse side effects.  Indicates his symptoms are adequately controlled with this medication regimen. Sleeping pattern appetite are sporadic secondary to work. Overall friends and family have noticed significant positive improvements since starting the medication.   No Known Allergies   Current Outpatient Prescriptions on File Prior to Visit  Medication Sig Dispense Refill  . atorvastatin (LIPITOR) 20 MG tablet Take 20 mg by mouth daily.    . famotidine (PEPCID) 20 MG tablet Take 20 mg by mouth daily.    Marland Kitchen. LORazepam (ATIVAN) 0.5 MG tablet Take 1 tablet (0.5 mg total) by mouth 2 (two) times daily as needed for anxiety. 40 tablet 0  . Ranitidine HCl (ACID REDUCER PO) Take 1 tablet by mouth daily as needed (acid reflux).     No current facility-administered medications on file prior to visit.    Review of Systems  Constitutional: Positive for unexpected weight change. Negative for fever and chills.  Respiratory: Negative for chest tightness and shortness of breath.   Cardiovascular: Negative for chest pain, palpitations and leg swelling.  Psychiatric/Behavioral: Negative for suicidal ideas, sleep disturbance and dysphoric mood. The patient is not nervous/anxious.       Objective:    BP 138/80 mmHg  Pulse 84  Temp(Src) 98.5 F (36.9 C) (Oral)  Resp 16  Ht 5\' 10"  (1.778 m)  Wt 287 lb (130.182 kg)  BMI 41.18 kg/m2  SpO2 96% Nursing note and vital signs reviewed.  Physical Exam  Constitutional: He is oriented to person,  place, and time. He appears well-developed and well-nourished. No distress.  Cardiovascular: Normal rate, regular rhythm, normal heart sounds and intact distal pulses.   Pulmonary/Chest: Effort normal and breath sounds normal.  Neurological: He is alert and oriented to person, place, and time.  Skin: Skin is warm and dry.  Psychiatric: He has a normal mood and affect. His behavior is normal. Judgment and thought content normal.       Assessment & Plan:   Problem List Items Addressed This Visit      Other   Anxiety and depression - Primary    Depression and anxiety are well controlled with the current regimen and no adverse side effects. Does have some weight gain which he is working on exercise. Continue current dosage of sertraline and lorazepam. Follow up in 6 months or sooner if needed.       Relevant Medications   sertraline (ZOLOFT) 50 MG tablet      I am having [name] maintain his Ranitidine HCl (ACID REDUCER PO), famotidine, LORazepam, atorvastatin, and sertraline.   Meds ordered this encounter  Medications  . sertraline (ZOLOFT) 50 MG tablet    Sig: Take 1 tablet (50 mg total) by mouth daily.    Dispense:  90 tablet    Refill:  1     Follow-up: Return in about 6 months (around 06/04/2016).  Jeanine Luzalone, Gregory, FNP

## 2015-12-03 NOTE — Patient Instructions (Addendum)
Thank you for choosing [facility].  Summary/Instructions:  Continue to take your medications as prescribed.  Your prescription(s) have been submitted to your pharmacy or been printed and provided for you. Please take as directed and contact our office if you believe you are having problem(s) with the medication(s) or have any questions.  If your symptoms worsen or fail to improve, please contact our office for further instruction, or in case of emergency go directly to the emergency room at the closest medical facility.     

## 2016-05-26 ENCOUNTER — Encounter: Payer: Self-pay | Admitting: Family

## 2016-05-26 DIAGNOSIS — F32A Depression, unspecified: Secondary | ICD-10-CM

## 2016-05-26 DIAGNOSIS — F329 Major depressive disorder, single episode, unspecified: Secondary | ICD-10-CM

## 2016-05-26 DIAGNOSIS — F419 Anxiety disorder, unspecified: Principal | ICD-10-CM

## 2016-05-26 MED ORDER — SERTRALINE HCL 50 MG PO TABS: 50.0000 mg | ORAL_TABLET | Freq: Every day | ORAL | 0 refills | Status: DC

## 2016-06-02 ENCOUNTER — Ambulatory Visit: Payer: BLUE CROSS/BLUE SHIELD | Admitting: Family

## 2016-06-09 ENCOUNTER — Ambulatory Visit: Payer: BLUE CROSS/BLUE SHIELD | Admitting: Family

## 2016-06-10 ENCOUNTER — Encounter: Payer: Self-pay | Admitting: Family

## 2016-06-10 ENCOUNTER — Ambulatory Visit (INDEPENDENT_AMBULATORY_CARE_PROVIDER_SITE_OTHER): Payer: BLUE CROSS/BLUE SHIELD | Admitting: Family

## 2016-06-10 VITALS — BP 132/78 | HR 88 | Temp 98.3°F | Resp 16 | Ht 70.0 in | Wt 294.0 lb

## 2016-06-10 DIAGNOSIS — J0100 Acute maxillary sinusitis, unspecified: Secondary | ICD-10-CM | POA: Diagnosis not present

## 2016-06-10 DIAGNOSIS — F419 Anxiety disorder, unspecified: Secondary | ICD-10-CM

## 2016-06-10 DIAGNOSIS — Z23 Encounter for immunization: Secondary | ICD-10-CM | POA: Diagnosis not present

## 2016-06-10 DIAGNOSIS — F418 Other specified anxiety disorders: Secondary | ICD-10-CM | POA: Diagnosis not present

## 2016-06-10 DIAGNOSIS — F329 Major depressive disorder, single episode, unspecified: Secondary | ICD-10-CM

## 2016-06-10 MED ORDER — LORAZEPAM 0.5 MG PO TABS: 0.5000 mg | ORAL_TABLET | Freq: Two times a day (BID) | ORAL | 0 refills | Status: DC | PRN

## 2016-06-10 MED ORDER — HYDROCOD POLST-CPM POLST ER 10-8 MG/5ML PO SUER: 5.0000 mL | Freq: Every evening | ORAL | 0 refills | Status: AC | PRN

## 2016-06-10 MED ORDER — AMOXICILLIN-POT CLAVULANATE 875-125 MG PO TABS: 1.0000 | ORAL_TABLET | Freq: Two times a day (BID) | ORAL | 0 refills | Status: DC

## 2016-06-10 MED ORDER — SERTRALINE HCL 50 MG PO TABS: 50.0000 mg | ORAL_TABLET | Freq: Every day | ORAL | 0 refills | Status: DC

## 2016-06-10 NOTE — Assessment & Plan Note (Signed)
Symptoms and exam consistent with maxillary sinusitis. Start Augmentin. Start Tussionex as needed for cough and sleep. Continue over-the-counter medications as needed for symptom relief and supportive care. Follow-up if symptoms worsen or fail to improve.

## 2016-06-10 NOTE — Progress Notes (Signed)
Subjective:    Patient ID: Andrew Luna, male    DOB: 10/16/1980, 35 y.o.   MRN: [id number]  Chief Complaint  Patient presents with  . Follow-up    starting over the weekend had sore throat, sinus drainage, left ear pressure, cough, has had pneumonia 3 times    HPI:  Andrew Luna is a 35 y.o. male who  has a past medical history of Allergy; Hyperlipidemia; Hypertension; and Migraines. and presents today for a follow up office visit.   1.) Anxiety and depression - Currently maintained on sertraline and lorazepam. Reports taking the medication as prescribed and denies adverse side effects. Symptoms are adequately controlled with current medication regimen. Using lorazepam as needed. Denies suicidal ideations.   2.) Cold-like symptoms - this is a new problem. Associated symptoms of sore throat, sinus drainage, left ear pressure, and cough have been going on for about 4 days. Denies fevers. Modifying factors include Mucinex-DM which has helped a little with the symptoms. Course of the symptoms is gradually worsening. No recent antibiotic use.    No Known Allergies    Outpatient Medications Prior to Visit  Medication Sig Dispense Refill  . atorvastatin (LIPITOR) 20 MG tablet Take 20 mg by mouth daily.    . famotidine (PEPCID) 20 MG tablet Take 20 mg by mouth daily.    . Ranitidine HCl (ACID REDUCER PO) Take 1 tablet by mouth daily as needed (acid reflux).    . LORazepam (ATIVAN) 0.5 MG tablet Take 1 tablet (0.5 mg total) by mouth 2 (two) times daily as needed for anxiety. 40 tablet 0  . sertraline (ZOLOFT) 50 MG tablet Take 1 tablet (50 mg total) by mouth daily. 30 tablet 0   No facility-administered medications prior to visit.      Past Medical History:  Diagnosis Date  . Allergy   . Hyperlipidemia   . Hypertension   . Migraines       Past Surgical History:  Procedure Laterality Date  . KNEE SURGERY        Family History  Problem Relation Age of Onset  .  Hyperlipidemia Mother   . Hypertension Mother   . Hyperlipidemia Father   . Hypertension Father   . Diabetes Father   . Heart disease Father   . Kidney disease Father       Social History   Social History  . Marital status: Single    Spouse name: N/A  . Number of children: 1  . Years of education: 4916   Occupational History  . Football Coach    Social History Main Topics  . Smoking status: Current Some Day Smoker    Types: Cigars  . Smokeless tobacco: Current User    Types: Snuff, Chew  . Alcohol use Yes     Comment: rare  . Drug use: No  . Sexual activity: Not on file   Other Topics Concern  . Not on file   Social History Narrative   Fun: Cook, workout, read    Review of Systems  Constitutional: Negative for chills and fever.  HENT: Positive for congestion, ear pain and sinus pressure.   Respiratory: Positive for cough. Negative for chest tightness and wheezing.   Cardiovascular: Negative for chest pain, palpitations and leg swelling.  Neurological: Negative for headaches.  Psychiatric/Behavioral: Negative for behavioral problems, confusion, decreased concentration, dysphoric mood, self-injury, sleep disturbance and suicidal ideas. The patient is not nervous/anxious.        Objective:  BP 132/78 (BP Location: Left Arm, Patient Position: Sitting, Cuff Size: Large)   Pulse 88   Temp 98.3 F (36.8 C) (Oral)   Resp 16   Ht 5\' 10"  (1.778 m)   Wt 294 lb (133.4 kg)   SpO2 98%   BMI 42.18 kg/m  Nursing note and vital signs reviewed.  Physical Exam  Constitutional: He is oriented to person, place, and time. He appears well-developed and well-nourished. No distress.  HENT:  Right Ear: Hearing, tympanic membrane, external ear and ear canal normal.  Left Ear: Hearing, tympanic membrane, external ear and ear canal normal.  Nose: Right sinus exhibits maxillary sinus tenderness and frontal sinus tenderness. Left sinus exhibits maxillary sinus tenderness and  frontal sinus tenderness.  Mouth/Throat: Uvula is midline, oropharynx is clear and moist and mucous membranes are normal.  Neck: Neck supple.  Cardiovascular: Normal rate, regular rhythm, normal heart sounds and intact distal pulses.   Pulmonary/Chest: Effort normal and breath sounds normal.  Neurological: He is alert and oriented to person, place, and time.  Skin: Skin is warm and dry.  Psychiatric: He has a normal mood and affect. His behavior is normal. Judgment and thought content normal.        Assessment & Plan:   Problem List Items Addressed This Visit      Respiratory   Acute non-recurrent maxillary sinusitis - Primary    Symptoms and exam consistent with maxillary sinusitis. Start Augmentin. Start Tussionex as needed for cough and sleep. Continue over-the-counter medications as needed for symptom relief and supportive care. Follow-up if symptoms worsen or fail to improve.      Relevant Medications   amoxicillin-clavulanate (AUGMENTIN) 875-125 MG tablet   chlorpheniramine-HYDROcodone (TUSSIONEX PENNKINETIC ER) 10-8 MG/5ML SUER     Other   Anxiety and depression    Anxiety and depression appear adequately managed with current medication regimen and no adverse side effects. [place] controlled substance database reviewed with no irregularities. Denies suicidal ideations. Continue current dosage of sertraline and lorazepam as needed.      Relevant Medications   LORazepam (ATIVAN) 0.5 MG tablet   sertraline (ZOLOFT) 50 MG tablet    Other Visit Diagnoses    Encounter for immunization       Relevant Orders   Flu Vaccine QUAD 36+ mos IM (Completed)       I am having [name] start on amoxicillin-clavulanate and chlorpheniramine-HYDROcodone. I am also having him maintain his Ranitidine HCl (ACID REDUCER PO), famotidine, atorvastatin, LORazepam, and sertraline.   Meds ordered this encounter  Medications  . LORazepam (ATIVAN) 0.5 MG tablet    Sig: Take 1 tablet  (0.5 mg total) by mouth 2 (two) times daily as needed for anxiety.    Dispense:  40 tablet    Refill:  0    Order Specific Question:   Supervising Provider    Answer:   Hillard Danker A [[id number]]  . sertraline (ZOLOFT) 50 MG tablet    Sig: Take 1 tablet (50 mg total) by mouth daily.    Dispense:  90 tablet    Refill:  0    Order Specific Question:   Supervising Provider    Answer:   Hillard Danker A [[id number]]  . amoxicillin-clavulanate (AUGMENTIN) 875-125 MG tablet    Sig: Take 1 tablet by mouth 2 (two) times daily.    Dispense:  20 tablet    Refill:  0    Order Specific Question:   Supervising Provider    Answer:  CRAWFORD, ELIZABETH A [[id number]]  . chlorpheniramine-HYDROcodone (TUSSIONEX PENNKINETIC ER) 10-8 MG/5ML SUER    Sig: Take 5 mLs by mouth at bedtime as needed.    Dispense:  115 mL    Refill:  0    Order Specific Question:   Supervising Provider    Answer:   Hillard DankerCRAWFORD, ELIZABETH A [[id number]]     Follow-up: Return in about 6 months (around 12/08/2016), or if symptoms worsen or fail to improve.  Jeanine Luzalone, Gregory, FNP .

## 2016-06-10 NOTE — Assessment & Plan Note (Signed)
Anxiety and depression appear adequately managed with current medication regimen and no adverse side effects. [place] controlled substance database reviewed with no irregularities. Denies suicidal ideations. Continue current dosage of sertraline and lorazepam as needed.

## 2016-06-10 NOTE — Patient Instructions (Signed)
Thank you for choosing [facility].  SUMMARY AND INSTRUCTIONS:  Medication:  Please continue to take your medication as prescribed.   Your prescription(s) have been submitted to your pharmacy or been printed and provided for you. Please take as directed and contact our office if you believe you are having problem(s) with the medication(s) or have any questions.  Follow up:  If your symptoms worsen or fail to improve, please contact our office for further instruction, or in case of emergency go directly to the emergency room at the closest medical facility.    General Recommendations:    Please drink plenty of fluids.  Get plenty of rest   Sleep in humidified air  Use saline nasal sprays  Netti pot   OTC Medications:  Decongestants - helps relieve congestion   Flonase (generic fluticasone) or Nasacort (generic triamcinolone) - please make sure to use the "cross-over" technique at a 45 degree angle towards the opposite eye as opposed to straight up the nasal passageway.   Sudafed (generic pseudoephedrine - Note this is the one that is available behind the pharmacy counter); Products with phenylephrine (-PE) may also be used but is often not as effective as pseudoephedrine.   If you have HIGH BLOOD PRESSURE - Coricidin HBP; AVOID any product that is -D as this contains pseudoephedrine which may increase your blood pressure.  Afrin (oxymetazoline) every 6-8 hours for up to 3 days.   Allergies - helps relieve runny nose, itchy eyes and sneezing   Claritin (generic loratidine), Allegra (fexofenidine), or Zyrtec (generic cyrterizine) for runny nose. These medications should not cause drowsiness.  Note - Benadryl (generic diphenhydramine) may be used however may cause drowsiness  Cough -   Delsym or Robitussin (generic dextromethorphan)  Expectorants - helps loosen mucus to ease removal   Mucinex (generic guaifenesin) as directed on the package.  Headaches /  General Aches   Tylenol (generic acetaminophen) - DO NOT EXCEED 3 grams (3,000 mg) in a 24 hour time period  Advil/Motrin (generic ibuprofen)   Sore Throat -   Salt water gargle   Chloraseptic (generic benzocaine) spray or lozenges / Sucrets (generic dyclonine)    Sinusitis Sinusitis is redness, soreness, and inflammation of the paranasal sinuses. Paranasal sinuses are air pockets within the bones of your face (beneath the eyes, the middle of the forehead, or above the eyes). In healthy paranasal sinuses, mucus is able to drain out, and air is able to circulate through them by way of your nose. However, when your paranasal sinuses are inflamed, mucus and air can become trapped. This can allow bacteria and other germs to grow and cause infection. Sinusitis can develop quickly and last only a short time (acute) or continue over a long period (chronic). Sinusitis that lasts for more than 12 weeks is considered chronic.  CAUSES  Causes of sinusitis include:  Allergies.  Structural abnormalities, such as displacement of the cartilage that separates your nostrils (deviated septum), which can decrease the air flow through your nose and sinuses and affect sinus drainage.  Functional abnormalities, such as when the small hairs (cilia) that line your sinuses and help remove mucus do not work properly or are not present. SIGNS AND SYMPTOMS  Symptoms of acute and chronic sinusitis are the same. The primary symptoms are pain and pressure around the affected sinuses. Other symptoms include:  Upper toothache.  Earache.  Headache.  Bad breath.  Decreased sense of smell and taste.  A cough, which worsens when you are  lying flat.  Fatigue.  Fever.  Thick drainage from your nose, which often is green and may contain pus (purulent).  Swelling and warmth over the affected sinuses. DIAGNOSIS  Your health care provider will perform a physical exam. During the exam, your health care provider  may:  Look in your nose for signs of abnormal growths in your nostrils (nasal polyps).  Tap over the affected sinus to check for signs of infection.  View the inside of your sinuses (endoscopy) using an imaging device that has a light attached (endoscope). If your health care provider suspects that you have chronic sinusitis, one or more of the following tests may be recommended:  Allergy tests.  Nasal culture. A sample of mucus is taken from your nose, sent to a lab, and screened for bacteria.  Nasal cytology. A sample of mucus is taken from your nose and examined by your health care provider to determine if your sinusitis is related to an allergy. TREATMENT  Most cases of acute sinusitis are related to a viral infection and will resolve on their own within 10 days. Sometimes medicines are prescribed to help relieve symptoms (pain medicine, decongestants, nasal steroid sprays, or saline sprays).  However, for sinusitis related to a bacterial infection, your health care provider will prescribe antibiotic medicines. These are medicines that will help kill the bacteria causing the infection.  Rarely, sinusitis is caused by a fungal infection. In theses cases, your health care provider will prescribe antifungal medicine. For some cases of chronic sinusitis, surgery is needed. Generally, these are cases in which sinusitis recurs more than 3 times per year, despite other treatments. HOME CARE INSTRUCTIONS   Drink plenty of water. Water helps thin the mucus so your sinuses can drain more easily.  Use a humidifier.  Inhale steam 3 to 4 times a day (for example, sit in the bathroom with the shower running).  Apply a warm, moist washcloth to your face 3 to 4 times a day, or as directed by your health care provider.  Use saline nasal sprays to help moisten and clean your sinuses.  Take medicines only as directed by your health care provider.  If you were prescribed either an antibiotic or  antifungal medicine, finish it all even if you start to feel better. SEEK IMMEDIATE MEDICAL CARE IF:  You have increasing pain or severe headaches.  You have nausea, vomiting, or drowsiness.  You have swelling around your face.  You have vision problems.  You have a stiff neck.  You have difficulty breathing. MAKE SURE YOU:   Understand these instructions.  Will watch your condition.  Will get help right away if you are not doing well or get worse. Document Released: 07/07/2005 Document Revised: 11/21/2013 Document Reviewed: 07/22/2011 [facility] Patient Information 2015 [place]. This information is not intended to replace advice given to you by your health care provider. Make sure you discuss any questions you have with your health care provider.

## 2016-09-16 ENCOUNTER — Other Ambulatory Visit: Payer: Self-pay | Admitting: Family

## 2016-09-16 DIAGNOSIS — F32A Depression, unspecified: Secondary | ICD-10-CM

## 2016-09-16 DIAGNOSIS — F329 Major depressive disorder, single episode, unspecified: Secondary | ICD-10-CM

## 2016-09-16 DIAGNOSIS — F419 Anxiety disorder, unspecified: Principal | ICD-10-CM

## 2016-12-08 ENCOUNTER — Ambulatory Visit (INDEPENDENT_AMBULATORY_CARE_PROVIDER_SITE_OTHER): Payer: BLUE CROSS/BLUE SHIELD | Admitting: Family

## 2016-12-08 ENCOUNTER — Encounter: Payer: Self-pay | Admitting: Family

## 2016-12-08 DIAGNOSIS — F329 Major depressive disorder, single episode, unspecified: Secondary | ICD-10-CM

## 2016-12-08 DIAGNOSIS — F419 Anxiety disorder, unspecified: Secondary | ICD-10-CM | POA: Diagnosis not present

## 2016-12-08 MED ORDER — LORAZEPAM 0.5 MG PO TABS: 0.5000 mg | ORAL_TABLET | Freq: Two times a day (BID) | ORAL | 1 refills | Status: AC | PRN

## 2016-12-08 MED ORDER — SERTRALINE HCL 50 MG PO TABS: 50.0000 mg | ORAL_TABLET | Freq: Every day | ORAL | 1 refills | Status: DC

## 2016-12-08 NOTE — Patient Instructions (Signed)
Thank you for choosing [facility].  SUMMARY AND INSTRUCTIONS:  Medication:  Your prescription(s) have been submitted to your pharmacy or been printed and provided for you. Please take as directed and contact our office if you believe you are having problem(s) with the medication(s) or have any questions.  Follow up:  If your symptoms worsen or fail to improve, please contact our office for further instruction, or in case of emergency go directly to the emergency room at the closest medical facility.     Sunburn Sunburn is damage to the skin that is caused by overexposure to ultraviolet (UV) rays. Repeated sun exposure causes early skin aging, such as wrinkles and sun spots. It also increases the risk of skin cancer.  CAUSES Sunburn is caused by getting too much UV radiation from the sun. RISK FACTORS The following factors may make you more likely to develop this condition:  Having a family history of sensitivity to the sun.  Having certain diseases, such as lupus.  Taking certain medicines.  Using certain cosmetics.  Having light-colored skin (light complexion). SYMPTOMS Symptoms of this condition include:  Red or pink skin.  Soreness and swelling of the affected areas.  Pain.  Blisters.  Peeling skin. You may also have a headache, fever, or fatigue if the sunburn covers a large part of your body. DIAGNOSIS This condition is diagnosed with a medical history and physical exam. TREATMENT Treatment focuses on managing your symptoms. Treatment may include:  Medicines to reduce swelling.  Steroid medicines to help with inflammation and itching. These may be applied as creams or taken by mouth (orally).  Antibiotic cream or ointment to apply to any blisters that break open. HOME CARE INSTRUCTIONS Medicines   Take or apply over-the-counter and prescription medicines only as told by your health care provider.  If you were prescribed an antibiotic medicine,  use it as told by your health care provider. Do not stop using the antibiotic even if your condition improves. General Instructions   Avoid further exposure to the sun. Protect sunburned skin by wearing clothing that covers the injured skin.  Do not put ice on your sunburn. This can cause further damage. Try taking a cool bath or applying a cool, wet cloth (cool compress) to your skin. This may help with pain.  Drink enough fluid to keep your urine clear or pale yellow.  Try applying aloe vera or a moisturizer that has soy in it to your sunburn. This may help. Do not apply aloe vera or moisturizer with soy if your sunburn has blisters.  Do not break any blisters that you may have. PREVENTION  Try to avoid the sun between 10:00 a.m. and 2:00 p.m. when it is the strongest.  Apply sunscreen at least 15 minutes before exposure to the sun.  Apply a sunscreen with an SPF of 15 or higher. Consider using an SPF of 30 or higher if you will be exposed to the sun for prolonged periods of time. Use a sunscreen that protects against all of the sun's rays (broad-spectrum) and is water-resistant.  Reapply sunscreen:  About every two hours during sun exposure.  More often when sweating a lot while out in the sun.  After getting wet from swimming or playing in water.  Wear long sleeves, a hat, and sunglasses that block UV light when you are outside.  Talk with your health care provider about medicines, herbs, and foods that can make you more sensitive to light. Avoid these, if possible.  Do not use tanning beds. SEEK MEDICAL CARE IF:  You have a fever.  Your symptoms do not improve with treatment.  Your pain is not controlled with medicine.  Your burn becomes more painful and swollen. SEEK IMMEDIATE MEDICAL CARE IF:  You start to vomit or have diarrhea.  You feel faint or you pass out.  You have a headache and you feel confused.  You develop severe blistering.  You have pus or  fluid coming from the blisters. This information is not intended to replace advice given to you by your health care provider. Make sure you discuss any questions you have with your health care provider. Document Released: 04/16/2005 Document Revised: 10/29/2015 Document Reviewed: 01/08/2015 Elsevier Interactive Patient Education  2017 [employer].

## 2016-12-08 NOTE — Progress Notes (Signed)
Subjective:    Patient ID: Andrew Luna, male    DOB: 08/28/1980, 36 y.o.   MRN: [id number]  Chief Complaint  Patient presents with  . Follow-up    asking for cream for sunburn    HPI:  Andrew ChimeClayton Fildes is a 36 y.o. male who  has a past medical history of Allergy; Hyperlipidemia; Hypertension; and Migraines. and presents today for a follow up office visit.   1.) Anxiety and depression - Currently maintained on Sertraline and alprazolam. Reports taking the medication as prescribed and denies adverse side effects. Symptoms are well controlled with the current medication regimen.   No Known Allergies    Outpatient Medications Prior to Visit  Medication Sig Dispense Refill  . chlorpheniramine-HYDROcodone (TUSSIONEX PENNKINETIC ER) 10-8 MG/5ML SUER Take 5 mLs by mouth at bedtime as needed. 115 mL 0  . famotidine (PEPCID) 20 MG tablet Take 20 mg by mouth daily.    . Ranitidine HCl (ACID REDUCER PO) Take 1 tablet by mouth daily as needed (acid reflux).    . LORazepam (ATIVAN) 0.5 MG tablet Take 1 tablet (0.5 mg total) by mouth 2 (two) times daily as needed for anxiety. 40 tablet 0  . sertraline (ZOLOFT) 50 MG tablet Take 1 tablet (50 mg total) by mouth daily. Yearly physical due in May must see Tammy SoursGreg for refills 90 tablet 0  . amoxicillin-clavulanate (AUGMENTIN) 875-125 MG tablet Take 1 tablet by mouth 2 (two) times daily. 20 tablet 0  . atorvastatin (LIPITOR) 20 MG tablet Take 20 mg by mouth daily.     No facility-administered medications prior to visit.     Review of Systems  Constitutional: Negative for chills and fever.  Respiratory: Negative for chest tightness and shortness of breath.   Psychiatric/Behavioral: Negative for agitation, behavioral problems, confusion, decreased concentration, dysphoric mood, self-injury and sleep disturbance. The patient is not nervous/anxious.       Objective:    BP (!) 142/82 (BP Location: Left Arm, Patient Position: Sitting, Cuff Size: Large)    Pulse 77   Temp 98.9 F (37.2 C) (Oral)   Resp 18   Ht 5\' 10"  (1.778 m)   Wt (!) 310 lb (140.6 kg)   SpO2 98%   BMI 44.48 kg/m  Nursing note and vital signs reviewed.  Physical Exam  Constitutional: He is oriented to person, place, and time. He appears well-developed and well-nourished. No distress.  Cardiovascular: Normal rate, regular rhythm, normal heart sounds and intact distal pulses.   Pulmonary/Chest: Effort normal and breath sounds normal.  Neurological: He is alert and oriented to person, place, and time.  Skin: Skin is warm and dry.  Psychiatric: He has a normal mood and affect. His behavior is normal. Judgment and thought content normal.       Assessment & Plan:   Problem List Items Addressed This Visit      Other   Anxiety and depression    Anxiety and depression actually controlled current medication regimen and no adverse side effects. Continue current dosage of lorazepam and sertraline. [place] controlled [place] reviewed with no irregularities.      Relevant Medications   LORazepam (ATIVAN) 0.5 MG tablet   sertraline (ZOLOFT) 50 MG tablet       I have discontinued Mr. Scharlene GlossHall's atorvastatin and amoxicillin-clavulanate. I have also changed his sertraline. Additionally, I am having him maintain his Ranitidine HCl (ACID REDUCER PO), famotidine, chlorpheniramine-HYDROcodone, rosuvastatin, and LORazepam.   Meds ordered this encounter  Medications  .  rosuvastatin (CRESTOR) 20 MG tablet    Sig: Take 20 mg by mouth daily.  Marland Kitchen LORazepam (ATIVAN) 0.5 MG tablet    Sig: Take 1 tablet (0.5 mg total) by mouth 2 (two) times daily as needed for anxiety.    Dispense:  40 tablet    Refill:  1    Order Specific Question:   Supervising Provider    Answer:   Hillard Danker A [[id number]]  . sertraline (ZOLOFT) 50 MG tablet    Sig: Take 1 tablet (50 mg total) by mouth daily.    Dispense:  90 tablet    Refill:  1    Order Specific Question:   Supervising  Provider    Answer:   Hillard Danker A [[id number]]     Follow-up: Return in about 6 months (around 06/10/2017), or if symptoms worsen or fail to improve.  Jeanine Luz, FNP

## 2016-12-08 NOTE — Assessment & Plan Note (Signed)
Anxiety and depression actually controlled current medication regimen and no adverse side effects. Continue current dosage of lorazepam and sertraline. [place] controlled [place] reviewed with no irregularities.

## 2016-12-13 ENCOUNTER — Other Ambulatory Visit: Payer: Self-pay | Admitting: Family

## 2016-12-13 DIAGNOSIS — F329 Major depressive disorder, single episode, unspecified: Secondary | ICD-10-CM

## 2016-12-13 DIAGNOSIS — F419 Anxiety disorder, unspecified: Principal | ICD-10-CM

## 2016-12-13 DIAGNOSIS — F32A Depression, unspecified: Secondary | ICD-10-CM

## 2017-02-27 ENCOUNTER — Ambulatory Visit: Payer: BLUE CROSS/BLUE SHIELD | Admitting: Family

## 2017-03-26 ENCOUNTER — Other Ambulatory Visit: Payer: Self-pay | Admitting: Family

## 2017-03-26 DIAGNOSIS — F329 Major depressive disorder, single episode, unspecified: Secondary | ICD-10-CM

## 2017-03-26 DIAGNOSIS — F419 Anxiety disorder, unspecified: Principal | ICD-10-CM

## 2017-05-06 IMAGING — CR DG CHEST 2V
2 series · 2 of 2 positions shown · non-contrast
Comparison: None.

CLINICAL DATA: Left chest pain radiating in the left axilla, onset
at [DATE]

EXAM:
CHEST  2 VIEW

[w chest pa]
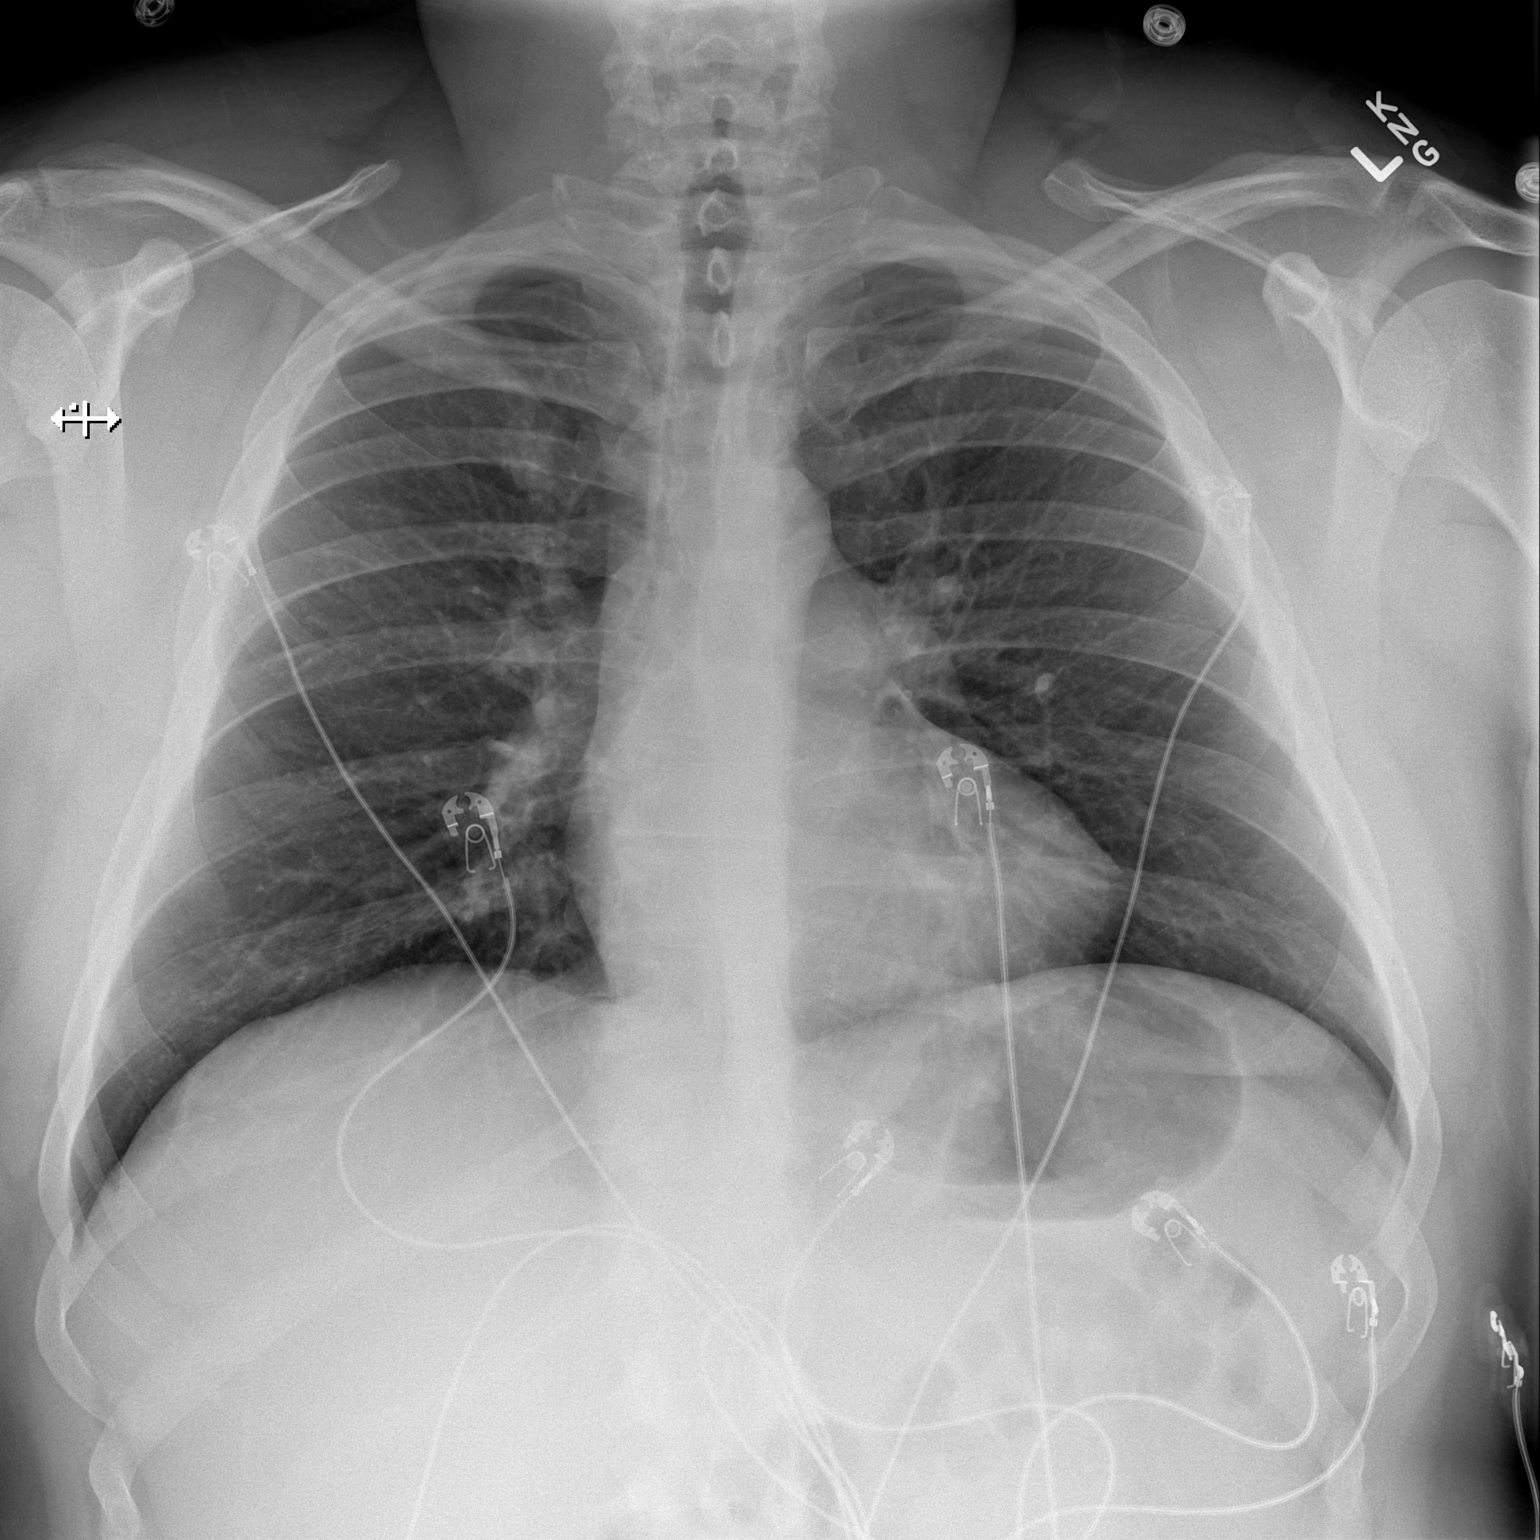

[w chest lat]
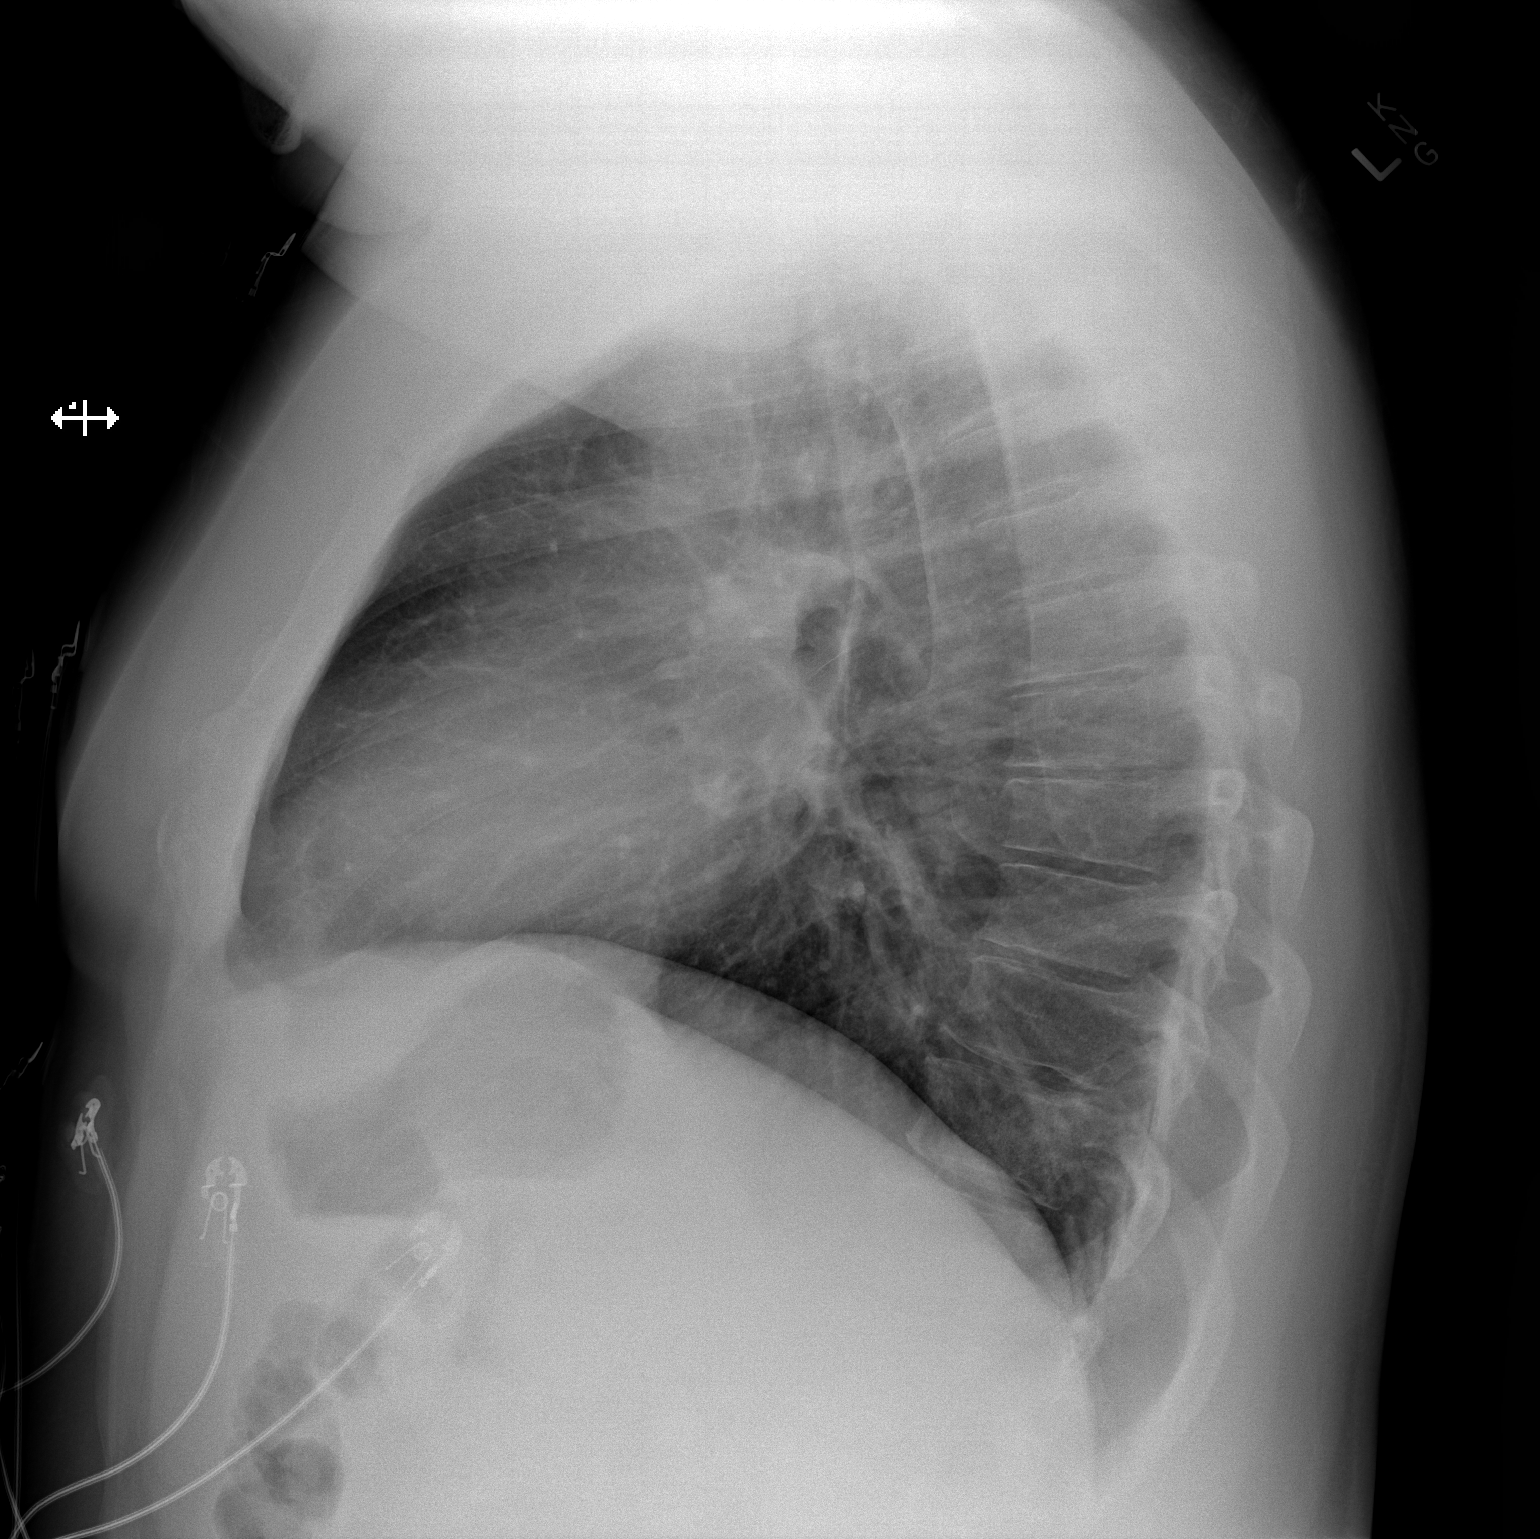

[2 of 2 positions shown; findings below may reference images not displayed]

FINDINGS: The heart size and mediastinal contours are within normal limits.
Both lungs are clear. The visualized skeletal structures are
unremarkable.
IMPRESSION: No active cardiopulmonary disease.

## 2017-06-25 ENCOUNTER — Other Ambulatory Visit: Payer: Self-pay | Admitting: Family

## 2017-06-25 DIAGNOSIS — F32A Depression, unspecified: Secondary | ICD-10-CM

## 2017-06-25 DIAGNOSIS — F329 Major depressive disorder, single episode, unspecified: Secondary | ICD-10-CM

## 2017-06-25 DIAGNOSIS — F419 Anxiety disorder, unspecified: Principal | ICD-10-CM

## 2017-07-02 ENCOUNTER — Other Ambulatory Visit: Payer: Self-pay | Admitting: *Deleted

## 2017-07-02 DIAGNOSIS — F329 Major depressive disorder, single episode, unspecified: Secondary | ICD-10-CM

## 2017-07-02 DIAGNOSIS — F419 Anxiety disorder, unspecified: Principal | ICD-10-CM

## 2017-07-18 ENCOUNTER — Other Ambulatory Visit: Payer: Self-pay | Admitting: Family

## 2017-07-18 DIAGNOSIS — F329 Major depressive disorder, single episode, unspecified: Secondary | ICD-10-CM

## 2017-07-18 DIAGNOSIS — F419 Anxiety disorder, unspecified: Principal | ICD-10-CM

## 2017-07-20 ENCOUNTER — Other Ambulatory Visit: Payer: Self-pay | Admitting: Family

## 2017-07-20 DIAGNOSIS — F329 Major depressive disorder, single episode, unspecified: Secondary | ICD-10-CM

## 2017-07-20 DIAGNOSIS — F419 Anxiety disorder, unspecified: Principal | ICD-10-CM

## 2017-07-20 DIAGNOSIS — F32A Depression, unspecified: Secondary | ICD-10-CM

## 2017-08-18 ENCOUNTER — Other Ambulatory Visit: Payer: Self-pay | Admitting: Nurse Practitioner

## 2017-08-18 DIAGNOSIS — F419 Anxiety disorder, unspecified: Principal | ICD-10-CM

## 2017-08-18 DIAGNOSIS — F329 Major depressive disorder, single episode, unspecified: Secondary | ICD-10-CM

## 2017-08-18 MED ORDER — SERTRALINE HCL 50 MG PO TABS: 50.0000 mg | ORAL_TABLET | Freq: Every day | ORAL | 0 refills | Status: AC

## 2017-08-18 NOTE — Telephone Encounter (Signed)
Per office policy sent enough refills to local pharmacy until appt.../lmb  

## 2017-08-18 NOTE — Telephone Encounter (Signed)
Copied from CRM [id number]. Topic: Inquiry >> Aug 18, 2017 12:35 PM Anice PaganiniMunoz, Joury Allcorn I, NT wrote: Reason for CRM: [facility]  [facility] pt need Zoloft 50 Mg  His have a app on 11/04/17

## 2017-11-04 ENCOUNTER — Ambulatory Visit: Payer: BLUE CROSS/BLUE SHIELD | Admitting: Nurse Practitioner

## 2017-11-04 DIAGNOSIS — Z2089 Contact with and (suspected) exposure to other communicable diseases: Secondary | ICD-10-CM

## 2017-11-15 ENCOUNTER — Other Ambulatory Visit: Payer: Self-pay | Admitting: Nurse Practitioner

## 2017-11-15 DIAGNOSIS — F419 Anxiety disorder, unspecified: Principal | ICD-10-CM

## 2017-11-15 DIAGNOSIS — F329 Major depressive disorder, single episode, unspecified: Secondary | ICD-10-CM

## 2023-04-20 ENCOUNTER — Other Ambulatory Visit: Payer: Self-pay
